# Patient Record
Sex: Male | Born: 1970 | Race: White | Hispanic: No | Marital: Single | Smoking: Current every day smoker
Health system: Southern US, Community
[De-identification: ages and names within clinical notes are randomized; demographics above are authoritative.]

## PROBLEM LIST (undated history)

## (undated) DIAGNOSIS — J449 Chronic obstructive pulmonary disease, unspecified: Secondary | ICD-10-CM

---

## 2002-08-16 ENCOUNTER — Inpatient Hospital Stay (HOSPITAL_COMMUNITY): Admission: EM | Admit: 2002-08-16 | Discharge: 2002-08-17 | Payer: Self-pay | Admitting: Emergency Medicine

## 2002-08-16 ENCOUNTER — Encounter: Payer: Self-pay | Admitting: Emergency Medicine

## 2004-01-23 ENCOUNTER — Emergency Department: Payer: Self-pay | Admitting: Emergency Medicine

## 2004-02-13 ENCOUNTER — Emergency Department: Payer: Self-pay | Admitting: Emergency Medicine

## 2004-08-13 ENCOUNTER — Emergency Department: Payer: Self-pay | Admitting: Emergency Medicine

## 2004-09-25 ENCOUNTER — Emergency Department: Payer: Self-pay | Admitting: Emergency Medicine

## 2004-10-09 ENCOUNTER — Emergency Department: Payer: Self-pay | Admitting: Emergency Medicine

## 2004-11-19 ENCOUNTER — Emergency Department: Payer: Self-pay | Admitting: Emergency Medicine

## 2005-08-08 ENCOUNTER — Emergency Department: Payer: Self-pay | Admitting: Emergency Medicine

## 2005-09-07 ENCOUNTER — Emergency Department: Payer: Self-pay | Admitting: Emergency Medicine

## 2007-05-18 ENCOUNTER — Emergency Department: Payer: Self-pay | Admitting: Emergency Medicine

## 2007-12-08 ENCOUNTER — Emergency Department: Payer: Self-pay | Admitting: Emergency Medicine

## 2007-12-08 ENCOUNTER — Other Ambulatory Visit: Payer: Self-pay

## 2008-09-26 ENCOUNTER — Emergency Department: Payer: Self-pay | Admitting: Emergency Medicine

## 2008-10-17 ENCOUNTER — Emergency Department: Payer: Self-pay | Admitting: Unknown Physician Specialty

## 2009-03-07 ENCOUNTER — Emergency Department: Payer: Self-pay | Admitting: Emergency Medicine

## 2009-05-05 ENCOUNTER — Emergency Department: Payer: Self-pay | Admitting: Emergency Medicine

## 2010-07-31 ENCOUNTER — Emergency Department: Payer: Self-pay | Admitting: Emergency Medicine

## 2010-08-07 ENCOUNTER — Emergency Department: Payer: Self-pay | Admitting: Internal Medicine

## 2011-09-16 ENCOUNTER — Emergency Department: Payer: Self-pay | Admitting: Internal Medicine

## 2011-09-16 LAB — COMPREHENSIVE METABOLIC PANEL
Albumin: 3.8 g/dL (ref 3.4–5.0)
Alkaline Phosphatase: 78 U/L (ref 50–136)
BUN: 16 mg/dL (ref 7–18)
Glucose: 91 mg/dL (ref 65–99)
Potassium: 4.1 mmol/L (ref 3.5–5.1)
SGOT(AST): 33 U/L (ref 15–37)
SGPT (ALT): 10 U/L — ABNORMAL LOW
Total Protein: 7.2 g/dL (ref 6.4–8.2)

## 2011-09-16 LAB — CBC
HCT: 41.8 % (ref 40.0–52.0)
HGB: 14.2 g/dL (ref 13.0–18.0)
MCHC: 33.9 g/dL (ref 32.0–36.0)
Platelet: 154 10*3/uL (ref 150–440)
WBC: 10 10*3/uL (ref 3.8–10.6)

## 2011-09-16 LAB — PROTIME-INR
INR: 0.9
Prothrombin Time: 12.4 secs (ref 11.5–14.7)

## 2011-09-16 LAB — LIPASE, BLOOD: Lipase: 208 U/L (ref 73–393)

## 2011-10-07 ENCOUNTER — Emergency Department: Payer: Self-pay | Admitting: Emergency Medicine

## 2011-10-13 ENCOUNTER — Emergency Department: Payer: Self-pay | Admitting: *Deleted

## 2012-01-03 ENCOUNTER — Emergency Department: Payer: Self-pay | Admitting: Emergency Medicine

## 2012-01-03 LAB — COMPREHENSIVE METABOLIC PANEL
Albumin: 4 g/dL (ref 3.4–5.0)
Anion Gap: 8 (ref 7–16)
Chloride: 102 mmol/L (ref 98–107)
EGFR (African American): >60
Osmolality: 274 (ref 275–301)
Potassium: 4.1 mmol/L (ref 3.5–5.1)
SGOT(AST): 21 U/L (ref 15–37)
Sodium: 137 mmol/L (ref 136–145)
Total Protein: 8 g/dL (ref 6.4–8.2)

## 2012-01-03 LAB — CBC
HCT: 45.1 % (ref 40.0–52.0)
MCHC: 34.1 g/dL (ref 32.0–36.0)
MCV: 93 fL (ref 80–100)
Platelet: 134 10*3/uL — ABNORMAL LOW (ref 150–440)
RDW: 13.5 % (ref 11.5–14.5)
WBC: 13.2 10*3/uL — ABNORMAL HIGH (ref 3.8–10.6)

## 2012-01-03 LAB — TROPONIN I
Troponin-I: <0.02 ng/mL
Troponin-I: <0.02 ng/mL

## 2012-01-03 LAB — ETHANOL
Ethanol %: <0.003 % (ref 0.000–0.080)
Ethanol: <3 mg/dL

## 2012-01-03 LAB — CK TOTAL AND CKMB (NOT AT ARMC)
CK, Total: 111 U/L (ref 35–232)
CK-MB: <0.5 ng/mL — ABNORMAL LOW (ref 0.5–3.6)

## 2012-01-03 LAB — LIPASE, BLOOD: Lipase: 120 U/L (ref 73–393)

## 2012-01-04 ENCOUNTER — Telehealth: Payer: Self-pay

## 2012-01-04 NOTE — Telephone Encounter (Signed)
I spoke with Silvio Pate at [facility]. She says they are booked out x 1 month for new pts but gave me # for [facility]. I called [facility] and spoke with rep there. She says they will call pt to schedule appt with him. I gave her pt's cell number. She will get in contact with pt.

## 2012-01-04 NOTE — Telephone Encounter (Signed)
Pt called saying he was seen in ER yesterday for c/o severe buring in chest Says the pain is made worse after eating Was dx with esophagitis in ER and was told to f/u with Dr. Mariah Milling  Pt calling to say he is having this pain again. Says he just ate and pain is "severe". Says it is burning/"on fire" in throat and above stomach area.  Pt was prescribed famotidine and prilosec in ER but he has yet to get this filled. He wants to know if he can be seen today.  I had pt hold while I discussed with Dr. Mariah Milling.  Dr. Mariah Milling feels this is GI related and should f/u with PCP ASAP. Pt does not have a PCP. Has Medicaid. Dr. Mariah Milling advised to have pt get RX filled and take prilosec 2 tablets daily with the famotidine. He also suggests we get pt into the Dr. Wyn Quaker clinic to be evaluated.    I informed pt of Dr. Windell Hummingbird instruction and told him I would call the clinic to get him an appt and will then call him back at [phone]. Understanding verb.

## 2012-01-10 ENCOUNTER — Encounter: Payer: Self-pay | Admitting: Cardiovascular Disease

## 2013-06-22 ENCOUNTER — Emergency Department: Payer: Self-pay | Admitting: Emergency Medicine

## 2013-06-22 LAB — CBC
HCT: 48.2 % (ref 40.0–52.0)
HGB: 15.4 g/dL (ref 13.0–18.0)
MCH: 29.3 pg (ref 26.0–34.0)
MCHC: 31.9 g/dL — AB (ref 32.0–36.0)
MCV: 92 fL (ref 80–100)
PLATELETS: 155 10*3/uL (ref 150–440)
RBC: 5.25 10*6/uL (ref 4.40–5.90)
RDW: 13.8 % (ref 11.5–14.5)
WBC: 5.2 10*3/uL (ref 3.8–10.6)

## 2013-06-22 LAB — COMPREHENSIVE METABOLIC PANEL
ALBUMIN: 4.1 g/dL (ref 3.4–5.0)
ALK PHOS: 98 U/L
ALT: 16 U/L (ref 12–78)
ANION GAP: 2 — AB (ref 7–16)
BILIRUBIN TOTAL: 0.2 mg/dL (ref 0.2–1.0)
BUN: 8 mg/dL (ref 7–18)
CO2: 28 mmol/L (ref 21–32)
Calcium, Total: 7.9 mg/dL — ABNORMAL LOW (ref 8.5–10.1)
Chloride: 113 mmol/L — ABNORMAL HIGH (ref 98–107)
Creatinine: 0.94 mg/dL (ref 0.60–1.30)
EGFR (African American): >60
EGFR (Non-African Amer.): >60
GLUCOSE: 115 mg/dL — AB (ref 65–99)
Osmolality: 284 (ref 275–301)
POTASSIUM: 3.8 mmol/L (ref 3.5–5.1)
SGOT(AST): 31 U/L (ref 15–37)
Sodium: 143 mmol/L (ref 136–145)
Total Protein: 8.2 g/dL (ref 6.4–8.2)

## 2013-06-22 LAB — SALICYLATE LEVEL: Salicylates, Serum: 3.8 mg/dL — ABNORMAL HIGH

## 2013-06-22 LAB — ACETAMINOPHEN LEVEL: Acetaminophen: <2 ug/mL

## 2013-06-22 LAB — ETHANOL
ETHANOL %: 0.322 % — AB (ref 0.000–0.080)
Ethanol: 322 mg/dL

## 2013-06-23 LAB — URINALYSIS, COMPLETE
Bacteria: NONE SEEN
Bilirubin,UR: NEGATIVE
Blood: NEGATIVE
Glucose,UR: NEGATIVE mg/dL (ref 0–75)
KETONE: NEGATIVE
LEUKOCYTE ESTERASE: NEGATIVE
NITRITE: NEGATIVE
PROTEIN: NEGATIVE
Ph: 5 (ref 4.5–8.0)
RBC,UR: NONE SEEN /HPF (ref 0–5)
Specific Gravity: 1.009 (ref 1.003–1.030)
Squamous Epithelial: NONE SEEN

## 2013-06-23 LAB — DRUG SCREEN, URINE

## 2013-10-25 ENCOUNTER — Emergency Department: Payer: Self-pay | Admitting: Emergency Medicine

## 2013-10-25 LAB — BASIC METABOLIC PANEL
Anion Gap: 8 (ref 7–16)
BUN: 11 mg/dL (ref 7–18)
Calcium, Total: 8 mg/dL — ABNORMAL LOW (ref 8.5–10.1)
Chloride: 110 mmol/L — ABNORMAL HIGH (ref 98–107)
Co2: 22 mmol/L (ref 21–32)
Creatinine: 1.15 mg/dL (ref 0.60–1.30)
EGFR (African American): >60
EGFR (Non-African Amer.): >60
Glucose: 97 mg/dL (ref 65–99)
Osmolality: 279 (ref 275–301)
Potassium: 3.4 mmol/L — ABNORMAL LOW (ref 3.5–5.1)
Sodium: 140 mmol/L (ref 136–145)

## 2013-10-25 LAB — URINALYSIS, COMPLETE
BACTERIA: NONE SEEN
BLOOD: NEGATIVE
Bilirubin,UR: NEGATIVE
Glucose,UR: NEGATIVE mg/dL (ref 0–75)
Ketone: NEGATIVE
LEUKOCYTE ESTERASE: NEGATIVE
Nitrite: NEGATIVE
PH: 5 (ref 4.5–8.0)
Protein: NEGATIVE
RBC, UR: NONE SEEN /HPF (ref 0–5)
Specific Gravity: 1.003 (ref 1.003–1.030)
Squamous Epithelial: NONE SEEN
WBC UR: <1 /HPF (ref 0–5)

## 2013-10-25 LAB — ETHANOL
Ethanol %: 0.326 % (ref 0.000–0.080)
Ethanol: 326 mg/dL

## 2013-10-25 LAB — CBC
HCT: 43.8 % (ref 40.0–52.0)
HGB: 14.7 g/dL (ref 13.0–18.0)
MCH: 31.6 pg (ref 26.0–34.0)
MCHC: 33.5 g/dL (ref 32.0–36.0)
MCV: 94 fL (ref 80–100)
Platelet: 144 10*3/uL — ABNORMAL LOW (ref 150–440)
RBC: 4.64 10*6/uL (ref 4.40–5.90)
RDW: 13.4 % (ref 11.5–14.5)
WBC: 9.1 10*3/uL (ref 3.8–10.6)

## 2013-10-25 LAB — DRUG SCREEN, URINE
Amphetamines, Ur Screen: NEGATIVE (ref ?–1000)
BARBITURATES, UR SCREEN: NEGATIVE (ref ?–200)
BENZODIAZEPINE, UR SCRN: NEGATIVE (ref ?–200)
Cannabinoid 50 Ng, Ur ~~LOC~~: NEGATIVE (ref ?–50)
Cocaine Metabolite,Ur ~~LOC~~: NEGATIVE (ref ?–300)
MDMA (ECSTASY) UR SCREEN: NEGATIVE (ref ?–500)
METHADONE, UR SCREEN: NEGATIVE (ref ?–300)
OPIATE, UR SCREEN: NEGATIVE (ref ?–300)
PHENCYCLIDINE (PCP) UR S: NEGATIVE (ref ?–25)
TRICYCLIC, UR SCREEN: NEGATIVE (ref ?–1000)

## 2014-02-20 ENCOUNTER — Emergency Department: Payer: Self-pay | Admitting: Emergency Medicine

## 2014-02-21 LAB — ETHANOL: Ethanol: 355 mg/dL

## 2015-09-28 ENCOUNTER — Emergency Department
Admission: EM | Admit: 2015-09-28 | Discharge: 2015-09-29 | Discharge status: Against Medical Advice | Payer: Medicaid Other | Attending: Emergency Medicine | Admitting: Emergency Medicine

## 2015-09-28 DIAGNOSIS — R197 Diarrhea, unspecified: Secondary | ICD-10-CM | POA: Diagnosis not present

## 2015-09-28 DIAGNOSIS — Z79899 Other long term (current) drug therapy: Secondary | ICD-10-CM | POA: Insufficient documentation

## 2015-09-28 DIAGNOSIS — F17200 Nicotine dependence, unspecified, uncomplicated: Secondary | ICD-10-CM | POA: Insufficient documentation

## 2015-09-28 DIAGNOSIS — R112 Nausea with vomiting, unspecified: Secondary | ICD-10-CM | POA: Diagnosis present

## 2015-09-28 DIAGNOSIS — R1030 Lower abdominal pain, unspecified: Secondary | ICD-10-CM | POA: Insufficient documentation

## 2015-09-28 DIAGNOSIS — J449 Chronic obstructive pulmonary disease, unspecified: Secondary | ICD-10-CM | POA: Insufficient documentation

## 2015-09-28 HISTORY — DX: Chronic obstructive pulmonary disease, unspecified: J44.9

## 2015-09-28 LAB — COMPREHENSIVE METABOLIC PANEL
ALBUMIN: 4.3 g/dL (ref 3.5–5.0)
ALK PHOS: 60 U/L (ref 38–126)
ALT: 11 U/L — AB (ref 17–63)
AST: 29 U/L (ref 15–41)
Anion gap: 8 (ref 5–15)
BUN: 14 mg/dL (ref 6–20)
CALCIUM: 9.2 mg/dL (ref 8.9–10.3)
CHLORIDE: 107 mmol/L (ref 101–111)
CO2: 27 mmol/L (ref 22–32)
Creatinine, Ser: 1.01 mg/dL (ref 0.61–1.24)
GFR calc Af Amer: >60 mL/min (ref >60)
GFR calc non Af Amer: >60 mL/min (ref >60)
Glucose, Bld: 92 mg/dL (ref 65–99)
Potassium: 3.4 mmol/L — ABNORMAL LOW (ref 3.5–5.1)
SODIUM: 142 mmol/L (ref 135–145)
Total Bilirubin: 0.7 mg/dL (ref 0.3–1.2)
Total Protein: 7.8 g/dL (ref 6.5–8.1)

## 2015-09-28 LAB — CBC
HCT: 45.7 % (ref 40.0–52.0)
HEMOGLOBIN: 15.9 g/dL (ref 13.0–18.0)
MCH: 33 pg (ref 26.0–34.0)
MCHC: 34.9 g/dL (ref 32.0–36.0)
MCV: 94.5 fL (ref 80.0–100.0)
Platelets: 154 10*3/uL (ref 150–440)
RBC: 4.83 MIL/uL (ref 4.40–5.90)
RDW: 13.9 % (ref 11.5–14.5)
WBC: 7.5 10*3/uL (ref 3.8–10.6)

## 2015-09-28 LAB — LIPASE, BLOOD: Lipase: 28 U/L (ref 11–51)

## 2015-09-28 MED ORDER — MORPHINE SULFATE (PF) 4 MG/ML IV SOLN
4.0000 mg | Freq: Once | INTRAVENOUS | Status: AC
  Administered 2015-09-28: 4 mg via INTRAVENOUS

## 2015-09-28 MED ORDER — ONDANSETRON HCL 4 MG/2ML IJ SOLN
4.0000 mg | Freq: Once | INTRAMUSCULAR | Status: AC
  Administered 2015-09-28: 4 mg via INTRAVENOUS
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
## 2015-09-28 NOTE — ED Provider Notes (Signed)
[facility] Emergency Department Provider Note   ____________________________________________  Time seen: Approximately 11:58 PM  I have reviewed the triage vital signs and the nursing notes.   HISTORY  Chief Complaint Abdominal Pain    HPI Tony Wright is a 45 y.o. male who comes into the hospital today with abdominal pain. The patient reports that he's had pain for the last week. He feels as though he has meals in his stomach. He reports the stomach is hurting bad and he hasn't taken anything for pain. The patient reports he wakes up every morning vomiting and he has nausea for the last 4 days. He reports that he also has 10 loose bowel movements daily. He reports that he's had the problems with his bowel movements for months. The patient drinks daily normally about 10 cans a day but he only had 6 cans today. The patient rates his pain a 10 out of 10 in intensity. He reports his never had pain this bad before. He is unsure what score on. He is here for evaluation.   Past Medical History  Diagnosis Date  . COPD (chronic obstructive pulmonary disease) (HCC)     There are no active problems to display for this patient.   History reviewed. No pertinent past surgical history.  Current Outpatient Rx  Name  Route  Sig  Dispense  Refill  . omeprazole (PRILOSEC) 20 MG capsule   Oral   Take 20 mg by mouth daily as needed (heartburn).           Allergies Review of patient's allergies indicates no known allergies.  History reviewed. No pertinent family history.  Social History Social History  Substance Use Topics  . Smoking status: Current Every Day Smoker  . Smokeless tobacco: None  . Alcohol Use: Yes    Review of Systems Constitutional: No fever/chills Eyes: No visual changes. ENT: No sore throat. Cardiovascular: Denies chest pain. Respiratory: Denies shortness of breath. Gastrointestinal:  abdominal pain, diarrhea, nausea and  vomiting Genitourinary: Negative for dysuria. Musculoskeletal: Negative for back pain. Skin: Negative for rash. Neurological: Negative for headaches, focal weakness or numbness.  10-point ROS otherwise negative.  ____________________________________________   PHYSICAL EXAM:  VITAL SIGNS: ED Triage Vitals  Enc Vitals Group     BP 09/28/15 2254 164/90 mmHg     Pulse Rate 09/28/15 2254 71     Resp 09/28/15 2254 16     Temp 09/28/15 2254 97.5 F (36.4 C)     Temp Source 09/28/15 2254 Oral     SpO2 09/28/15 2254 100 %     Weight 09/28/15 2254 100 lb (45.36 kg)     Height 09/28/15 2254 5\' 6"  (1.676 m)     Head Cir --      Peak Flow --      Pain Score 09/28/15 2257 8     Pain Loc --      Pain Edu? --      Excl. in GC? --     Constitutional: Alert and oriented. Well appearing and in mild distress. Eyes: Conjunctivae are normal. PERRL. EOMI. Head: Atraumatic. Nose: No congestion/rhinnorhea. Mouth/Throat: Mucous membranes are moist.  Oropharynx non-erythematous. Cardiovascular: Normal rate, regular rhythm. Grossly normal heart sounds.  Good peripheral circulation. Respiratory: Normal respiratory effort.  No retractions. Lungs CTAB. Gastrointestinal: Soft with diffuse tenderness to palpation. No distention. positive bowel sounds Musculoskeletal: No lower extremity tenderness nor edema.   Neurologic:  Normal speech and language.  Skin:  Skin is warm, dry and intact.  Psychiatric: Mood and affect are normal.   ____________________________________________   LABS (all labs ordered are listed, but only abnormal results are displayed)  Labs Reviewed  COMPREHENSIVE METABOLIC PANEL - Abnormal; Notable for the following:    Potassium 3.4 (*)    ALT 11 (*)    All other components within normal limits  URINALYSIS COMPLETEWITH MICROSCOPIC ([facility] ONLY) - Abnormal; Notable for the following:    Color, Urine STRAW (*)    APPearance CLEAR (*)    Specific Gravity, Urine 1.002 (*)     Hgb urine dipstick 1+ (*)    Squamous Epithelial / LPF 0-5 (*)    All other components within normal limits  ETHANOL - Abnormal; Notable for the following:    Alcohol, Ethyl (B) 238 (*)    All other components within normal limits  LIPASE, BLOOD  CBC  TROPONIN I   ____________________________________________  EKG  ED ECG REPORT I, Rebecka Apley, the attending physician, personally viewed and interpreted this ECG.   Date: 09/28/2015  EKG Time: 2301  Rate: 72  Rhythm: normal sinus rhythm  Axis: normal  Intervals:none  ST&T Change: none  ____________________________________________  RADIOLOGY  CT abd and pelvis: pending ____________________________________________   PROCEDURES  Procedure(s) performed: None  Procedures  Critical Care performed: No  ____________________________________________   INITIAL IMPRESSION / ASSESSMENT AND PLAN / ED COURSE  Pertinent labs & imaging results that were available during my care of the patient were reviewed by me and considered in my medical decision making (see chart for details).  This is a 45 year old male who comes into the hospital today with abdominal pain. The patient does drink daily. I will check some blood work as well as a CT scan and reevaluate the patient.  As the patient was awaiting the results of his CT scan the patient stated that he wanted to leave so he got outside and smoke. We informed him that he would not be able to do so. The patient became very upset and decided that he wanted to leave. He reports he does not want to be here all night and he has no other need to be here. The patient's CT results were not back at the time of the patient's disposition. The patient signed to leave AGAINST MEDICAL ADVICE. The patient is walking around does not slurring his speech and is clinically sober. The patient's family is also here with him. The patient understands that as we do not have all of the results there is a  risk that he may go home and he may die and he reports that he accepts that risk. The patient left the emergency department without the results of his imaging.   ----------------------------------------- 2:47 AM on 09/29/2015 -----------------------------------------  CT abdomen and pelvis: Sigmoid thickening is likely chronic and from diverticulosis, if diarrhea colitis would also be considered  The patient again left the hospital and we will attempt to get his results to him. ____________________________________________   FINAL CLINICAL IMPRESSION(S) / ED DIAGNOSES  Final diagnoses:  Lower abdominal pain      NEW MEDICATIONS STARTED DURING THIS VISIT:  Discharge Medication List as of 09/29/2015  2:30 AM       Note:  This document was prepared using Dragon voice recognition software and may include unintentional dictation errors.    Rebecka Apley, MD 09/29/15 [phone]

## 2015-09-28 NOTE — ED Notes (Signed)
Pt is cursing a lot  Pt states his middle abdomen has been hurting for a week or so. States diarrhea but denies N&V. Pt states he drinks "everyday." States he drank 6 beers today. Pt states abdomen feels like "pins and needles".   Hx of ETHOH abuse, COPD

## 2015-09-29 ENCOUNTER — Emergency Department: Payer: Medicaid Other

## 2015-09-29 LAB — URINALYSIS COMPLETE WITH MICROSCOPIC (ARMC ONLY)
BACTERIA UA: NONE SEEN
BILIRUBIN URINE: NEGATIVE
Glucose, UA: NEGATIVE mg/dL
Ketones, ur: NEGATIVE mg/dL
LEUKOCYTES UA: NEGATIVE
NITRITE: NEGATIVE
PH: 6 (ref 5.0–8.0)
Protein, ur: NEGATIVE mg/dL
RBC / HPF: NONE SEEN RBC/hpf (ref 0–5)
Specific Gravity, Urine: 1.002 — ABNORMAL LOW (ref 1.005–1.030)

## 2015-09-29 LAB — TROPONIN I: Troponin I: <0.03 ng/mL (ref <0.03)

## 2015-09-29 LAB — ETHANOL: ALCOHOL ETHYL (B): 238 mg/dL — AB (ref <5)

## 2015-09-29 MED ORDER — GI COCKTAIL ~~LOC~~
30.0000 mL | Freq: Once | ORAL | Status: AC
  Administered 2015-09-29: 30 mL via ORAL
  Filled 2015-09-29: qty 30

## 2015-09-29 MED ORDER — DIATRIZOATE MEGLUMINE & SODIUM 66-10 % PO SOLN
30.0000 mL | Freq: Once | ORAL | Status: AC
  Administered 2015-09-29: 30 mL via ORAL

## 2015-09-29 MED ORDER — IOPAMIDOL (ISOVUE-300) INJECTION 61%
75.0000 mL | Freq: Once | INTRAVENOUS | Status: AC | PRN
  Administered 2015-09-29: 75 mL via INTRAVENOUS

## 2015-09-29 MED ORDER — SODIUM CHLORIDE 0.9 % IV BOLUS (SEPSIS)
1000.0000 mL | Freq: Once | INTRAVENOUS | Status: AC
  Administered 2015-09-29: 1000 mL via INTRAVENOUS

## 2015-09-29 MED ORDER — ONDANSETRON HCL 4 MG/2ML IJ SOLN
INTRAMUSCULAR | Status: AC
  Administered 2015-09-28: 4 mg via INTRAVENOUS
  Filled 2015-09-29: qty 2

## 2015-09-29 MED ORDER — MORPHINE SULFATE (PF) 4 MG/ML IV SOLN
INTRAVENOUS | Status: AC
  Administered 2015-09-28: 4 mg via INTRAVENOUS
  Filled 2015-09-29: qty 1

## 2015-09-29 NOTE — ED Notes (Signed)
Pt transported to CT via stretcher.  

## 2015-09-29 NOTE — ED Notes (Signed)
Pt finished drinking 1st bottle of contrast, gave GI cocktail at this time because pt won't start drinking 2nd bottle of contrast until 1:20am.   Pt cursing every few words.

## 2015-09-29 NOTE — ED Notes (Signed)
Pt is finished with 2nd bottle, CT notified.

## 2015-09-29 NOTE — ED Notes (Signed)
Pt is cursing at this RN about not receiving pain medicine. Pt was informed that he has had pain medicine.

## 2015-09-29 NOTE — ED Notes (Signed)
Pt agitated, yelling at staff. Stating he wants to go outside and smoke, informed he is not allowed to do that and that this Rn could get him a nicotene patch. Pt refused. Pt informed about signing out AMA, pt still yelling at staff. Pt ambulatory to lobby with [employer].

## 2015-10-21 ENCOUNTER — Emergency Department
Admission: EM | Admit: 2015-10-21 | Discharge: 2015-10-21 | Disposition: A | Discharge status: Home / Self Care | Payer: Medicaid Other | Attending: Emergency Medicine | Admitting: Emergency Medicine

## 2015-10-21 ENCOUNTER — Emergency Department: Payer: Medicaid Other

## 2015-10-21 DIAGNOSIS — F17200 Nicotine dependence, unspecified, uncomplicated: Secondary | ICD-10-CM | POA: Insufficient documentation

## 2015-10-21 DIAGNOSIS — R079 Chest pain, unspecified: Secondary | ICD-10-CM | POA: Diagnosis present

## 2015-10-21 DIAGNOSIS — J441 Chronic obstructive pulmonary disease with (acute) exacerbation: Secondary | ICD-10-CM

## 2015-10-21 LAB — CBC WITH DIFFERENTIAL/PLATELET
BASOS PCT: 0 %
Basophils Absolute: 0 10*3/uL (ref 0–0.1)
EOS PCT: 2 %
Eosinophils Absolute: 0.1 10*3/uL (ref 0–0.7)
HEMATOCRIT: 43 % (ref 40.0–52.0)
Hemoglobin: 14.8 g/dL (ref 13.0–18.0)
Lymphocytes Relative: 30 %
Lymphs Abs: 2.5 10*3/uL (ref 1.0–3.6)
MCH: 33.2 pg (ref 26.0–34.0)
MCHC: 34.5 g/dL (ref 32.0–36.0)
MCV: 96.2 fL (ref 80.0–100.0)
MONO ABS: 0.5 10*3/uL (ref 0.2–1.0)
MONOS PCT: 7 %
NEUTROS ABS: 5.1 10*3/uL (ref 1.4–6.5)
Neutrophils Relative %: 61 %
Platelets: 120 10*3/uL — ABNORMAL LOW (ref 150–440)
RBC: 4.47 MIL/uL (ref 4.40–5.90)
RDW: 13.4 % (ref 11.5–14.5)
WBC: 8.3 10*3/uL (ref 3.8–10.6)

## 2015-10-21 LAB — BASIC METABOLIC PANEL
Anion gap: 6 (ref 5–15)
BUN: 7 mg/dL (ref 6–20)
CALCIUM: 9.1 mg/dL (ref 8.9–10.3)
CO2: 25 mmol/L (ref 22–32)
CREATININE: 0.92 mg/dL (ref 0.61–1.24)
Chloride: 109 mmol/L (ref 101–111)
GFR calc Af Amer: >60 mL/min (ref >60)
GFR calc non Af Amer: >60 mL/min (ref >60)
GLUCOSE: 87 mg/dL (ref 65–99)
Potassium: 3.9 mmol/L (ref 3.5–5.1)
Sodium: 140 mmol/L (ref 135–145)

## 2015-10-21 LAB — TROPONIN I: Troponin I: <0.03 ng/mL (ref <0.03)

## 2015-10-21 MED ORDER — FAMOTIDINE 20 MG PO TABS
40.0000 mg | ORAL_TABLET | Freq: Once | ORAL | Status: AC
  Administered 2015-10-21: 40 mg via ORAL

## 2015-10-21 MED ORDER — IPRATROPIUM-ALBUTEROL 0.5-2.5 (3) MG/3ML IN SOLN
RESPIRATORY_TRACT | Status: AC
  Filled 2015-10-21: qty 3

## 2015-10-21 MED ORDER — ALBUTEROL SULFATE (2.5 MG/3ML) 0.083% IN NEBU
2.5000 mg | INHALATION_SOLUTION | Freq: Once | RESPIRATORY_TRACT | Status: AC
  Administered 2015-10-21: 2.5 mg via RESPIRATORY_TRACT

## 2015-10-21 MED ORDER — FAMOTIDINE 20 MG PO TABS
ORAL_TABLET | ORAL | Status: AC
  Administered 2015-10-21: 40 mg via ORAL
  Filled 2015-10-21: qty 2

## 2015-10-21 MED ORDER — AZITHROMYCIN 250 MG PO TABS: ORAL_TABLET | ORAL | 0 refills | Status: AC

## 2015-10-21 MED ORDER — AZITHROMYCIN 500 MG PO TABS
ORAL_TABLET | ORAL | Status: AC
  Administered 2015-10-21: 500 mg via ORAL
  Filled 2015-10-21: qty 1

## 2015-10-21 MED ORDER — AZITHROMYCIN 250 MG PO TABS
500.0000 mg | ORAL_TABLET | Freq: Once | ORAL | Status: AC
  Administered 2015-10-21: 500 mg via ORAL

## 2015-10-21 MED ORDER — ALBUTEROL SULFATE HFA 108 (90 BASE) MCG/ACT IN AERS: 2.0000 | INHALATION_SPRAY | RESPIRATORY_TRACT | 0 refills | Status: AC | PRN

## 2015-10-21 MED ORDER — ALBUTEROL SULFATE (2.5 MG/3ML) 0.083% IN NEBU
INHALATION_SOLUTION | RESPIRATORY_TRACT | Status: AC
  Administered 2015-10-21: 2.5 mg via RESPIRATORY_TRACT
  Filled 2015-10-21: qty 3

## 2015-10-21 MED ORDER — PREDNISONE 20 MG PO TABS
ORAL_TABLET | ORAL | Status: AC
  Administered 2015-10-21: 60 mg via ORAL
  Filled 2015-10-21: qty 3

## 2015-10-21 MED ORDER — OMEPRAZOLE 20 MG PO CPDR: 20.0000 mg | DELAYED_RELEASE_CAPSULE | Freq: Every day | ORAL | 0 refills | Status: DC

## 2015-10-21 MED ORDER — PREDNISONE 20 MG PO TABS: 40.0000 mg | ORAL_TABLET | Freq: Every day | ORAL | 0 refills | Status: DC

## 2015-10-21 MED ORDER — PREDNISONE 20 MG PO TABS
60.0000 mg | ORAL_TABLET | Freq: Once | ORAL | Status: AC
  Administered 2015-10-21: 60 mg via ORAL

## 2015-10-21 NOTE — ED Provider Notes (Signed)
[facility] Emergency Department Provider Note   ____________________________________________   First MD Initiated Contact with Patient 10/21/15 2129     (approximate)  I have reviewed the triage vital signs and the nursing notes.   HISTORY  Chief Complaint Shortness of Breath   HPI Tony Wright is a 45 y.o. male with COPD as well as alcoholism who is presenting to the emergency department with shortness of breath over the past 2-3 weeks. He says that shortness of breath is worsened night and especially when he lays down. He denies any productive cough or fever. He says that he is out of his inhaler and thinks that this is the problem with his shortness of breath at this time. He is also expressing that he has had mild chest pain with his coughing over the past 2 weeks. Denies any pain at this time. Denies any chest trauma. Says that he smokes one to 2 packs per day. Says that he has only been drinking 3 drinks per day per week this week. Is also requesting a refill of his Prilosec.Patient says the chest pain is anterior nonradiating wanted as there. Does not report any pain with deep inspiration.   Past Medical History:  Diagnosis Date  . COPD (chronic obstructive pulmonary disease) (HCC)     There are no active problems to display for this patient.   No past surgical history on file.  Prior to Admission medications   Medication Sig Start Date End Date Taking? Authorizing Provider  omeprazole (PRILOSEC) 20 MG capsule Take 20 mg by mouth daily as needed (heartburn).    Historical Provider, MD    Allergies Review of patient's allergies indicates no known allergies.  No family history on file.  Social History Social History  Substance Use Topics  . Smoking status: Current Every Day Smoker  . Smokeless tobacco: Not on file  . Alcohol use Yes    Review of Systems Constitutional: No fever/chills Eyes: No visual changes. ENT: No sore  throat. Cardiovascular: As above Respiratory: As above Gastrointestinal: No abdominal pain.  No nausea, no vomiting.  No diarrhea.  No constipation. Genitourinary: Negative for dysuria. Musculoskeletal: Negative for back pain. Skin: Negative for rash. Neurological: Negative for headaches, focal weakness or numbness.  10-point ROS otherwise negative.  ____________________________________________   PHYSICAL EXAM:  VITAL SIGNS: ED Triage Vitals  Enc Vitals Group     BP 10/21/15 2115 127/86     Pulse Rate 10/21/15 2115 92     Resp 10/21/15 2115 17     Temp 10/21/15 2115 98 F (36.7 C)     Temp Source 10/21/15 2115 Oral     SpO2 10/21/15 2115 95 %     Weight 10/21/15 2115 100 lb (45.4 kg)     Height 10/21/15 2115 5\' 6"  (1.676 m)     Head Circumference --      Peak Flow --      Pain Score 10/21/15 2120 8     Pain Loc --      Pain Edu? --      Excl. in GC? --     Constitutional: Alert and oriented. Well appearing and in no acute distress. Eyes: Conjunctivae are normal. PERRL. EOMI. Head: Atraumatic. Nose: No congestion/rhinnorhea. Mouth/Throat: Mucous membranes are moist.   Neck: No stridor.   Cardiovascular: Normal rate, regular rhythm.Heart rate of 79 in the room. Grossly normal heart sounds.  Good peripheral circulation. Respiratory: Normal respiratory effort.  No retractions. Very  mild and scant wheezing on forced expiration. Clear to auscultation bilaterally when breathing normally. Gastrointestinal: Soft and nontender. No distention.  No CVA tenderness. Musculoskeletal: No lower extremity tenderness nor edema.  No joint effusions. Neurologic:  Normal speech and language. No gross focal neurologic deficits are appreciated. No gait instability. Skin:  Skin is warm, dry and intact. No rash noted. Psychiatric: Mood and affect are normal. Speech and behavior are normal.  ____________________________________________   LABS (all labs ordered are listed, but only abnormal  results are displayed)  Labs Reviewed  TROPONIN I  CBC WITH DIFFERENTIAL/PLATELET  BASIC METABOLIC PANEL   ____________________________________________  EKG  ED ECG REPORT I, Zedekiah Hinderman,  Teena Irani, the attending physician, personally viewed and interpreted this ECG.   Date: 10/21/2015  EKG Time: 2120  Rate: 99  Rhythm: normal sinus rhythm  Axis: Normal  Intervals:none  ST&T Change: No ST segment elevation or depression. Single T-wave inversion in aVL. No significant change from EKG done on 02/20/2014. ____________________________________________  RADIOLOGY DG Chest 2 View (Accession [id number]) (Order [id number])  Imaging  Date: 10/21/2015 Department: [facility] EMERGENCY DEPARTMENT Released By: Gerre Couch, RN (auto-released) Authorizing: Jeanmarie Plant, MD  PACS Images   Show images for DG Chest 2 View  Study Result   CLINICAL DATA:  Initial evaluation for acute chest pain shortness of breath for 1 week. History of COPD.  EXAM: CHEST  2 VIEW  COMPARISON:  Prior radiograph from 01/03/2012.  FINDINGS: Cardiac and mediastinal silhouettes are stable in size and contour, and remain within normal limits.  Lungs are hyperinflated with attenuation of the pulmonary markings, suggesting emphysema. No focal infiltrate, pulmonary edema, or pleural effusion. No pneumothorax.  No acute osseous abnormality.  IMPRESSION: 1. No active cardiopulmonary disease. 2. COPD.   Electronically Signed   By: Rise Mu M.D.   On: 10/21/2015 22:04     ____________________________________________   PROCEDURES  Procedure(s) performed:   Procedures  Critical Care performed:   ____________________________________________   INITIAL IMPRESSION / ASSESSMENT AND PLAN / ED COURSE  Pertinent labs & imaging results that were available during my care of the patient were reviewed by me and considered in my medical decision making (see chart  for details).  ----------------------------------------- 11:15 PM on 10/21/2015 -----------------------------------------  Patient was very reassuring labs as well as x-ray results. I'll treat the patient for COPD exacerbation. He is also requesting a Careers adviser for his Prilosec. He is able to ambulate without any difficulty. He is without any wheezing at this time.  Clinical Course     ____________________________________________   FINAL CLINICAL IMPRESSION(S) / ED DIAGNOSES  COPD exacerbation.    NEW MEDICATIONS STARTED DURING THIS VISIT:  New Prescriptions   No medications on file     Note:  This document was prepared using Dragon voice recognition software and may include unintentional dictation errors.    Myrna Blazer, MD 10/21/15 [phone]

## 2015-10-21 NOTE — ED Notes (Signed)
Pt back from Xray. Patient ambulated to and from Xray with NAD noted

## 2015-10-21 NOTE — ED Triage Notes (Signed)
Pt in with co shob and chest pain for 1 week, hx of the same. Coughing noted in triage with no shob at this time.

## 2015-12-09 ENCOUNTER — Emergency Department
Admission: EM | Admit: 2015-12-09 | Discharge: 2015-12-10 | Disposition: A | Discharge status: Home / Self Care | Payer: Medicaid Other | Attending: Emergency Medicine | Admitting: Emergency Medicine

## 2015-12-09 ENCOUNTER — Encounter: Payer: Self-pay | Admitting: Emergency Medicine

## 2015-12-09 ENCOUNTER — Emergency Department: Payer: Medicaid Other

## 2015-12-09 DIAGNOSIS — Y929 Unspecified place or not applicable: Secondary | ICD-10-CM | POA: Insufficient documentation

## 2015-12-09 DIAGNOSIS — F17200 Nicotine dependence, unspecified, uncomplicated: Secondary | ICD-10-CM | POA: Diagnosis not present

## 2015-12-09 DIAGNOSIS — Y999 Unspecified external cause status: Secondary | ICD-10-CM | POA: Diagnosis not present

## 2015-12-09 DIAGNOSIS — F10121 Alcohol abuse with intoxication delirium: Secondary | ICD-10-CM | POA: Insufficient documentation

## 2015-12-09 DIAGNOSIS — F1010 Alcohol abuse, uncomplicated: Secondary | ICD-10-CM

## 2015-12-09 DIAGNOSIS — F10921 Alcohol use, unspecified with intoxication delirium: Secondary | ICD-10-CM

## 2015-12-09 DIAGNOSIS — Y939 Activity, unspecified: Secondary | ICD-10-CM | POA: Diagnosis not present

## 2015-12-09 DIAGNOSIS — Z79899 Other long term (current) drug therapy: Secondary | ICD-10-CM | POA: Diagnosis not present

## 2015-12-09 DIAGNOSIS — S0181XA Laceration without foreign body of other part of head, initial encounter: Secondary | ICD-10-CM | POA: Diagnosis not present

## 2015-12-09 DIAGNOSIS — J449 Chronic obstructive pulmonary disease, unspecified: Secondary | ICD-10-CM | POA: Insufficient documentation

## 2015-12-09 DIAGNOSIS — S0993XA Unspecified injury of face, initial encounter: Secondary | ICD-10-CM | POA: Diagnosis present

## 2015-12-09 LAB — CBC WITH DIFFERENTIAL/PLATELET
BASOS PCT: 0 %
Basophils Absolute: 0 10*3/uL (ref 0–0.1)
EOS ABS: 0.1 10*3/uL (ref 0–0.7)
Eosinophils Relative: 1 %
HEMATOCRIT: 44.7 % (ref 40.0–52.0)
HEMOGLOBIN: 15.3 g/dL (ref 13.0–18.0)
LYMPHS ABS: 2.1 10*3/uL (ref 1.0–3.6)
Lymphocytes Relative: 23 %
MCH: 32.9 pg (ref 26.0–34.0)
MCHC: 34.3 g/dL (ref 32.0–36.0)
MCV: 95.9 fL (ref 80.0–100.0)
MONO ABS: 0.5 10*3/uL (ref 0.2–1.0)
MONOS PCT: 6 %
NEUTROS ABS: 6.2 10*3/uL (ref 1.4–6.5)
NEUTROS PCT: 70 %
Platelets: 137 10*3/uL — ABNORMAL LOW (ref 150–440)
RBC: 4.67 MIL/uL (ref 4.40–5.90)
RDW: 13.4 % (ref 11.5–14.5)
WBC: 8.8 10*3/uL (ref 3.8–10.6)

## 2015-12-09 LAB — COMPREHENSIVE METABOLIC PANEL
ALK PHOS: 67 U/L (ref 38–126)
ALT: 9 U/L — ABNORMAL LOW (ref 17–63)
ANION GAP: 10 (ref 5–15)
AST: 45 U/L — ABNORMAL HIGH (ref 15–41)
Albumin: 4.3 g/dL (ref 3.5–5.0)
BILIRUBIN TOTAL: 0.5 mg/dL (ref 0.3–1.2)
BUN: 13 mg/dL (ref 6–20)
CALCIUM: 8.5 mg/dL — AB (ref 8.9–10.3)
CO2: 21 mmol/L — ABNORMAL LOW (ref 22–32)
Chloride: 111 mmol/L (ref 101–111)
Creatinine, Ser: 1.02 mg/dL (ref 0.61–1.24)
Glucose, Bld: 91 mg/dL (ref 65–99)
POTASSIUM: 3.8 mmol/L (ref 3.5–5.1)
Sodium: 142 mmol/L (ref 135–145)
TOTAL PROTEIN: 7.5 g/dL (ref 6.5–8.1)

## 2015-12-09 LAB — TROPONIN I

## 2015-12-09 LAB — ETHANOL: ALCOHOL ETHYL (B): 358 mg/dL — AB (ref <5)

## 2015-12-09 MED ORDER — DIPHENHYDRAMINE HCL 50 MG/ML IJ SOLN: 50.0000 mg | Freq: Once | INTRAMUSCULAR | Status: DC

## 2015-12-09 MED ORDER — HALOPERIDOL LACTATE 5 MG/ML IJ SOLN
INTRAMUSCULAR | Status: AC
  Administered 2015-12-09: 10 mg via INTRAMUSCULAR
  Filled 2015-12-09: qty 2

## 2015-12-09 MED ORDER — DIPHENHYDRAMINE HCL 50 MG/ML IJ SOLN
50.0000 mg | Freq: Once | INTRAMUSCULAR | Status: AC
Start: 2015-12-09 — End: 2015-12-09
  Administered 2015-12-09: 50 mg via INTRAMUSCULAR

## 2015-12-09 MED ORDER — LORAZEPAM 2 MG/ML IJ SOLN
INTRAMUSCULAR | Status: AC
  Administered 2015-12-09: 2 mg via INTRAMUSCULAR
  Filled 2015-12-09: qty 1

## 2015-12-09 MED ORDER — DIPHENHYDRAMINE HCL 50 MG/ML IJ SOLN
INTRAMUSCULAR | Status: AC
  Filled 2015-12-09: qty 1

## 2015-12-09 MED ORDER — LORAZEPAM 2 MG/ML IJ SOLN
2.0000 mg | Freq: Once | INTRAMUSCULAR | Status: AC
  Administered 2015-12-09: 2 mg via INTRAMUSCULAR

## 2015-12-09 MED ORDER — HALOPERIDOL LACTATE 5 MG/ML IJ SOLN
10.0000 mg | Freq: Once | INTRAMUSCULAR | Status: AC
  Administered 2015-12-09: 10 mg via INTRAMUSCULAR
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
## 2015-12-09 NOTE — ED Notes (Signed)
Pt attempting to leave, stating, "I'm fine, I have someone out there waiting for me. I want to go home." MD notified. Pt placed under IVC by MD. Verbal orders received for IM medications. Pt took IM medications without incidence. Pt dressed out by Onalee Huaavid, RN and Kennith Centerracey, EDT.
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
## 2015-12-10 DIAGNOSIS — F1010 Alcohol abuse, uncomplicated: Secondary | ICD-10-CM
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
## 2015-12-10 NOTE — ED Provider Notes (Signed)
EXAM: CT HEAD WITHOUT CONTRAST  CT MAXILLOFACIAL WITHOUT CONTRAST  TECHNIQUE: Multidetector CT imaging of the head and maxillofacial structures were performed using the standard protocol without intravenous contrast. Multiplanar CT image reconstructions of the maxillofacial structures were also generated.  COMPARISON:  Head CT dated 02/21/2014  FINDINGS: CT HEAD FINDINGS  Brain: The ventricles and sulci appropriate in size for patient's age. Mild periventricular and deep white matter chronic microvascular ischemic changes noted. There is no acute intracranial hemorrhage. No mass effect or midline shift noted. No extra-axial fluid collection.  Vascular: No hyperdense vessel or unexpected calcification.  Skull: Normal. Negative for fracture or focal lesion.  Sinuses/Orbits: Mild mucoperiosteal thickening of paranasal sinuses. No air-fluid levels. The mastoid air cells are clear.  Other: There is skin laceration of the forehead with a small left forehead hematoma. A 2 mm high attenuating focus in the skin over the left forehead (series 3, image 27) may have been present on the prior CT and possibly chronic. Clinical correlation is recommended to evaluate for foreign object.  CT MAXILLOFACIAL FINDINGS  Osseous: There is a minimally depressed fracture of the right nasal bone, age indeterminate. There is no associated swelling of the overlying soft tissue. Although this may represent an old fracture. Acute fracture is not excluded. No other acute fracture identified. There is no dislocation.  Orbits: Negative. No traumatic or inflammatory finding.  Sinuses: There is mild mucoperiosteal thickening of paranasal sinuses. No air-fluid levels. The mastoid air cells are clear. There is chronic appearing erosive changes of the left maxilla likely related to chronic dental disease and periapical abscess. There is associated exophytic calcified mass extending into the  left maxillary sinus likely related to chronic dystrophic calcification.  Soft tissues: Left forehead and left periorbital soft tissue swelling and hematoma.  Multiple coarse calcific densities along the medial aspect of the right mandible may represent calcified lymph nodes versus less likely salivary duct stones. These measure up to 6 mm in diameter. There is no ductal dilatation.  IMPRESSION: No acute intracranial pathology.  Age indeterminate minimally displaced right nasal bone fracture. Clinical correlation is recommended.  Exophytic calcified mass extending into the left maxillary sinus, likely related to chronic dental disease and periapical abscess.   Electronically Signed   By: Elgie CollardArash  Radparvar M.D.   On: 12/10/2015 00:44   On exam there is no bruising tenderness laceration or anything on the right side of the patient's nose. There is an abrasion on the left orbital ridge.   Arnaldo NatalPaul F Yolinda Duerr, MD 12/10/15 [phone]

## 2015-12-10 NOTE — ED Notes (Signed)
Pt given urine cup for sample; he states he is ready to leave. Explained that we need urine sample and that he will need to see psychiatrist before a decision is made.
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
## 2015-12-10 NOTE — ED Notes (Signed)
Pt discharged home after verbalizing understanding of discharge instructions; nad noted. 

## 2015-12-10 NOTE — Discharge Instructions (Signed)
Please keep all your abrasions and lacerations clean and dry.  Please return to the emergency department for thoughts of hurting yourself or anyone else, hallucinations, or any other symptoms concerning to you.
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
## 2016-02-14 ENCOUNTER — Emergency Department
Admission: EM | Admit: 2016-02-14 | Discharge: 2016-02-14 | Disposition: A | Discharge status: Home / Self Care | Payer: Medicaid Other | Attending: Emergency Medicine | Admitting: Emergency Medicine

## 2016-02-14 ENCOUNTER — Encounter: Payer: Self-pay | Admitting: Emergency Medicine

## 2016-02-14 ENCOUNTER — Emergency Department: Payer: Medicaid Other

## 2016-02-14 DIAGNOSIS — J449 Chronic obstructive pulmonary disease, unspecified: Secondary | ICD-10-CM | POA: Diagnosis not present

## 2016-02-14 DIAGNOSIS — R1030 Lower abdominal pain, unspecified: Secondary | ICD-10-CM | POA: Diagnosis not present

## 2016-02-14 DIAGNOSIS — F17200 Nicotine dependence, unspecified, uncomplicated: Secondary | ICD-10-CM | POA: Diagnosis not present

## 2016-02-14 LAB — URINALYSIS COMPLETE WITH MICROSCOPIC (ARMC ONLY)
BILIRUBIN URINE: NEGATIVE
Bacteria, UA: NONE SEEN
Glucose, UA: NEGATIVE mg/dL
Hgb urine dipstick: NEGATIVE
KETONES UR: NEGATIVE mg/dL
Nitrite: NEGATIVE
PROTEIN: NEGATIVE mg/dL
Specific Gravity, Urine: 1.016 (ref 1.005–1.030)
pH: 5 (ref 5.0–8.0)

## 2016-02-14 LAB — COMPREHENSIVE METABOLIC PANEL
ALBUMIN: 4 g/dL (ref 3.5–5.0)
ALK PHOS: 70 U/L (ref 38–126)
ALT: 10 U/L — AB (ref 17–63)
AST: 28 U/L (ref 15–41)
Anion gap: 8 (ref 5–15)
BUN: 10 mg/dL (ref 6–20)
CALCIUM: 9.6 mg/dL (ref 8.9–10.3)
CO2: 26 mmol/L (ref 22–32)
CREATININE: 0.93 mg/dL (ref 0.61–1.24)
Chloride: 105 mmol/L (ref 101–111)
GFR calc Af Amer: >60 mL/min (ref >60)
GFR calc non Af Amer: >60 mL/min (ref >60)
GLUCOSE: 105 mg/dL — AB (ref 65–99)
Potassium: 3.9 mmol/L (ref 3.5–5.1)
SODIUM: 139 mmol/L (ref 135–145)
Total Bilirubin: 1.1 mg/dL (ref 0.3–1.2)
Total Protein: 7.4 g/dL (ref 6.5–8.1)

## 2016-02-14 LAB — CBC
HCT: 40.1 % (ref 40.0–52.0)
Hemoglobin: 13.9 g/dL (ref 13.0–18.0)
MCH: 33.4 pg (ref 26.0–34.0)
MCHC: 34.7 g/dL (ref 32.0–36.0)
MCV: 96.2 fL (ref 80.0–100.0)
PLATELETS: 131 10*3/uL — AB (ref 150–440)
RBC: 4.17 MIL/uL — ABNORMAL LOW (ref 4.40–5.90)
RDW: 13.7 % (ref 11.5–14.5)
WBC: 10.1 10*3/uL (ref 3.8–10.6)

## 2016-02-14 LAB — CHLAMYDIA/NGC RT PCR (ARMC ONLY)
Chlamydia Tr: NOT DETECTED
N GONORRHOEAE: NOT DETECTED

## 2016-02-14 LAB — LIPASE, BLOOD: Lipase: 24 U/L (ref 11–51)

## 2016-02-14 MED ORDER — METRONIDAZOLE 500 MG PO TABS
500.0000 mg | ORAL_TABLET | Freq: Once | ORAL | Status: AC
  Administered 2016-02-14: 500 mg via ORAL

## 2016-02-14 MED ORDER — IOPAMIDOL (ISOVUE-300) INJECTION 61%
30.0000 mL | Freq: Once | INTRAVENOUS | Status: AC | PRN
  Administered 2016-02-14: 30 mL via ORAL
  Filled 2016-02-14: qty 30

## 2016-02-14 MED ORDER — CIPROFLOXACIN HCL 500 MG PO TABS
500.0000 mg | ORAL_TABLET | Freq: Once | ORAL | Status: AC
  Administered 2016-02-14: 500 mg via ORAL

## 2016-02-14 MED ORDER — CIPROFLOXACIN HCL 500 MG PO TABS
ORAL_TABLET | ORAL | Status: AC
  Administered 2016-02-14: 500 mg via ORAL
  Filled 2016-02-14: qty 1

## 2016-02-14 MED ORDER — METRONIDAZOLE 500 MG PO TABS
ORAL_TABLET | ORAL | Status: AC
  Administered 2016-02-14: 500 mg via ORAL
  Filled 2016-02-14: qty 1

## 2016-02-14 MED ORDER — METRONIDAZOLE 500 MG PO TABS: 500.0000 mg | ORAL_TABLET | Freq: Three times a day (TID) | ORAL | 0 refills | Status: AC

## 2016-02-14 MED ORDER — SODIUM CHLORIDE 0.9 % IV BOLUS (SEPSIS)
1000.0000 mL | Freq: Once | INTRAVENOUS | Status: AC
  Administered 2016-02-14: 1000 mL via INTRAVENOUS

## 2016-02-14 MED ORDER — IOPAMIDOL (ISOVUE-300) INJECTION 61%
85.0000 mL | Freq: Once | INTRAVENOUS | Status: AC | PRN
  Administered 2016-02-14: 85 mL via INTRAVENOUS
  Filled 2016-02-14: qty 90

## 2016-02-14 MED ORDER — CIPROFLOXACIN HCL 500 MG PO TABS: 500.0000 mg | ORAL_TABLET | Freq: Two times a day (BID) | ORAL | 0 refills | Status: AC

## 2016-02-14 NOTE — ED Notes (Signed)
CT staff at bedside to give oral CT contrast to patient.

## 2016-02-14 NOTE — ED Notes (Signed)
ED Provider at bedside. 

## 2016-02-14 NOTE — ED Notes (Addendum)
Pt up to bathroom, ambulates with ease. Back to bed at this time. MD at bedside.

## 2016-02-14 NOTE — ED Provider Notes (Signed)
[facility] Emergency Department Provider Note  ____________________________________________   First MD Initiated Contact with Patient 02/14/16 1445     (approximate)  I have reviewed the triage vital signs and the nursing notes.   HISTORY  Chief Complaint Urinary Frequency   HPI Tony Wright is a 45 y.o. male with a history of COPD who is presenting to the emergency department today with lower abdominal pain. He says the pain is sharp and a 9 out of 10. He says it has been there constantly but does get worse and better without any warning. He says it started suddenly at 2 AM yesterday morning. He denies any nausea vomiting or diarrhea but does say that he has some burning with urination. He denies any urethral discharge or concern for STDs. Says that he was here in July for a similar issue but left the emergency department before getting the final results of his workup. Denies any recent unexpected weight loss. Denies any history of kidney stones. Denies any radiation of the pain through to his back.Also reports drinking about 8-10 beers per day. Has never had a colonoscopy.   Past Medical History:  Diagnosis Date  . COPD (chronic obstructive pulmonary disease) [facility])     Patient Active Problem List   Diagnosis Date Noted  . Alcohol abuse 12/10/2015    History reviewed. No pertinent surgical history.  Prior to Admission medications   Medication Sig Start Date End Date Taking? Authorizing Provider  albuterol (PROVENTIL HFA;VENTOLIN HFA) 108 (90 Base) MCG/ACT inhaler Inhale 2 puffs into the lungs every 4 (four) hours as needed for wheezing or shortness of breath. 10/21/15   Myrna Blazer, MD    Allergies Patient has no known allergies.  History reviewed. No pertinent family history.  Social History Social History  Substance Use Topics  . Smoking status: Current Every Day Smoker  . Smokeless tobacco: Never Used  . Alcohol use Yes     Review of Systems Constitutional: No fever/chills Eyes: No visual changes. ENT: No sore throat. Cardiovascular: Denies chest pain. Respiratory: Denies shortness of breath. Gastrointestinal: No nausea, no vomiting.  No diarrhea.  No constipation. Genitourinary: As above Musculoskeletal: Negative for back pain. Skin: Negative for rash. Neurological: Negative for headaches, focal weakness or numbness.  10-point ROS otherwise negative.  ____________________________________________   PHYSICAL EXAM:  VITAL SIGNS: ED Triage Vitals  Enc Vitals Group     BP 02/14/16 1300 (!) 161/96     Pulse Rate 02/14/16 1300 85     Resp 02/14/16 1300 20     Temp 02/14/16 1300 97.6 F (36.4 C)     Temp Source 02/14/16 1300 Oral     SpO2 02/14/16 1300 100 %     Weight 02/14/16 1300 120 lb (54.4 kg)     Height --      Head Circumference --      Peak Flow --      Pain Score 02/14/16 1301 10     Pain Loc --      Pain Edu? --      Excl. in GC? --     Constitutional: Alert and oriented. Well appearing and in no acute distress. Eyes: Conjunctivae are normal. PERRL. EOMI. Head: Atraumatic. Nose: No congestion/rhinnorhea. Mouth/Throat: Mucous membranes are moist.   Neck: No stridor.   Cardiovascular: Normal rate, regular rhythm. Grossly normal heart sounds.   Respiratory: Normal respiratory effort.  No retractions. Lungs CTAB. Gastrointestinal: Soft and nontender. No distention.  No CVA tenderness. Genitourinary:  Normal external appearance of the genitalia in this circumcised male. No tenderness to the penis or the testicles. No masses palpated. No discharge seen at the urethral meatus. Musculoskeletal: No lower extremity tenderness nor edema.  No joint effusions. Neurologic:  Normal speech and language. No gross focal neurologic deficits are appreciated. No gait instability. Skin:  Skin is warm, dry and intact. No rash noted. Psychiatric: Mood and affect are normal. Speech and behavior are  normal.  ____________________________________________   LABS (all labs ordered are listed, but only abnormal results are displayed)  Labs Reviewed  COMPREHENSIVE METABOLIC PANEL - Abnormal; Notable for the following:       Result Value   Glucose, Bld 105 (*)    ALT 10 (*)    All other components within normal limits  CBC - Abnormal; Notable for the following:    RBC 4.17 (*)    Platelets 131 (*)    All other components within normal limits  URINALYSIS COMPLETEWITH MICROSCOPIC ([facility] ONLY) - Abnormal; Notable for the following:    Color, Urine YELLOW (*)    APPearance CLEAR (*)    Leukocytes, UA 1+ (*)    Squamous Epithelial / LPF 0-5 (*)    All other components within normal limits  CHLAMYDIA/NGC RT PCR ([facility] ONLY)  URINE CULTURE  LIPASE, BLOOD   ____________________________________________  EKG   ____________________________________________  RADIOLOGY    CT Abdomen Pelvis W Contrast (Final result)  Result time 02/14/16 17:57:26  Final result by Eugenie Norrieobrinka V Dimitrova-Koutleva, MD (02/14/16 17:57:26)           Narrative:   CLINICAL DATA: Sharp mid and lower abdominal pain.  EXAM: CT ABDOMEN AND PELVIS WITH CONTRAST  TECHNIQUE: Multidetector CT imaging of the abdomen and pelvis was performed using the standard protocol following bolus administration of intravenous contrast.  CONTRAST: 85mL ISOVUE-300 IOPAMIDOL (ISOVUE-300) INJECTION 61%  COMPARISON: 09/29/2015  FINDINGS: Lower chest: Hyperinflation of the lungs.  Hepatobiliary: No focal liver abnormality is seen. No gallstones, gallbladder wall thickening, or biliary dilatation.  Pancreas: Unremarkable. No pancreatic ductal dilatation or surrounding inflammatory changes.  Spleen: Normal in size without focal abnormality.  Adrenals/Urinary Tract: Adrenal glands are unremarkable. Kidneys are normal, without renal calculi, focal lesion, or hydronephrosis. Bladder is  unremarkable.  Stomach/Bowel: Stomach is within normal limits. No evidence of appendicitis. No evidence of bowel obstruction. There is long segment of diffuse mucosal thickening of the distal sigmoid colon and proximal rectum. Scattered diverticula are also seen.  Vascular/Lymphatic: Aortic atherosclerosis. No enlarged abdominal or pelvic lymph nodes.  Reproductive: Heterogeneous appearance of the prostate gland. Diffuse symmetric urinary bladder wall thickening.  Other: No abdominal wall hernia or abnormality. No abdominopelvic ascites.  Musculoskeletal: No acute or significant osseous findings.  IMPRESSION: Long segment of diffuse mucosal thickening of the distal sigmoid colon and proximal rectum on the background of scattered diverticula. This may represent colitis, potentially diverticulitis. Underlying malignancy cannot be entirely excluded and correlation with colonoscopy, upon resolution of the acute symptoms is recommended.  Diffuse symmetric urinary bladder wall thickening and heterogeneous appearance of the prostate gland. Please correlate clinically regarding potential urinary bladder chronic outflow obstruction.   Electronically Signed By: Ted Mcalpineobrinka Dimitrova M.D. On: 02/14/2016 17:57          ____________________________________________   PROCEDURES  Procedure(s) performed:   Procedures  Critical Care performed:   ____________________________________________   INITIAL IMPRESSION / ASSESSMENT AND PLAN / ED COURSE  Pertinent labs & imaging results that were  available during my care of the patient were reviewed by me and considered in my medical decision making (see chart for details).  Clinical Course   ----------------------------------------- 6:45 PM on 02/14/2016 -----------------------------------------  Patient with findings that could be infection versus malignancy. I discussed this with him as well as the need for an outpatient  colonoscopy. He is understanding of the plan and willing to comply. Will be discharged with Cipro and Flagyl. Blood pressure high on recheck but patient also says that he needs to smoke a cigarette and is slightly anxious. This may be causing his high blood pressure.   ____________________________________________   FINAL CLINICAL IMPRESSION(S) / ED DIAGNOSES  Lower abdominal pain.    NEW MEDICATIONS STARTED DURING THIS VISIT:  New Prescriptions   No medications on file     Note:  This document was prepared using Dragon voice recognition software and may include unintentional dictation errors.    Myrna Blazeravid Matthew Niasha Devins, MD 02/14/16 [phone]

## 2016-02-14 NOTE — ED Notes (Signed)
Sharp lower mid abdominal pain since 2AM yesterday. Denies NVD. Normal BM today. Pt alert and oriented X4, active, cooperative, pt in NAD. RR even and unlabored, color WNL.

## 2016-02-14 NOTE — ED Triage Notes (Signed)
Pt to ed with c/o  Lower abd pain and burning worse with voiding.

## 2016-02-14 NOTE — ED Notes (Signed)
Pt alert and oriented X4, active, cooperative, pt in NAD. RR even and unlabored, color WNL.  Pt informed to return if any life threatening symptoms occur.  Pt encouraged to have close followup with PCP due to high BP. EDP aware of discharge BP reading. No further orders.

## 2016-02-14 NOTE — ED Notes (Signed)
Pt requesting IV be taken out, pt states his parents are on way to pick him up. Pt c/o being hungry and needing cigarette.

## 2016-02-14 NOTE — ED Notes (Signed)
Pt finished with oral CT contrast, CT staff informed and is on way to get patient.

## 2016-02-15 LAB — URINE CULTURE: CULTURE: NO GROWTH

## 2016-03-07 ENCOUNTER — Ambulatory Visit (INDEPENDENT_AMBULATORY_CARE_PROVIDER_SITE_OTHER): Payer: Medicaid Other | Admitting: Gastroenterology

## 2016-03-07 ENCOUNTER — Encounter: Payer: Self-pay | Admitting: Gastroenterology

## 2016-03-07 VITALS — BP 171/100 | HR 94 | Temp 98.1°F | Ht 65.0 in | Wt 93.0 lb

## 2016-03-07 DIAGNOSIS — F1010 Alcohol abuse, uncomplicated: Secondary | ICD-10-CM

## 2016-03-07 DIAGNOSIS — I10 Essential (primary) hypertension: Secondary | ICD-10-CM | POA: Diagnosis not present

## 2016-03-07 DIAGNOSIS — R935 Abnormal findings on diagnostic imaging of other abdominal regions, including retroperitoneum: Secondary | ICD-10-CM

## 2016-03-07 MED ORDER — AMLODIPINE BESYLATE 5 MG PO TABS: 5.0000 mg | ORAL_TABLET | Freq: Every day | ORAL | 1 refills | Status: AC

## 2016-03-07 MED ORDER — AMLODIPINE BESYLATE 5 MG PO TABS: 5.0000 mg | ORAL_TABLET | Freq: Every day | ORAL | 1 refills | Status: DC

## 2016-03-07 NOTE — Progress Notes (Signed)
Gastroenterology Consultation  Referring Provider:     No ref. provider found Primary Care Physician:  Pcp Not In System Primary Gastroenterologist:  Dr. Wyline MoodKiran Alis Sawchuk  Reason for Consultation:     Abdominal pain         HPI:   Tony Wright Loss is a 45 y.o. y/o male referred for consultation & management  by Dr. Oneita HurtPcp Not In System.    He is here today referred by the ER for abdominal pain.Marland Kitchen. He was seen at the ER on 02/14/16 when he presented with sudden onset abdominal pain associated with burning during urination . At that time admitted to drinking 8-10 beers a day . He underwent a CT scan of the abdomen which revealed diffuse mucosal thickening of the distal sigmoid colon and proximal rectum with a background of diverticula. Malignancy ,diverticulitis not excluded. Also noted were bladder wall thickening and heterogenous appearance of his prostate gland .   Hb 13.9 , low platelet count of 131.LFT and creatinine were normal  He was discharged on Ciprofloxacin and flagyl with need for OP follow up . He has never had a colonoscopy .  A prior CT scan in 09/2015 revealed thickening of the sigmoid colon as well    He says after his ER visit he did not take his antibiotics because he could not afford it.  He smokes daily . Not seen his PCP for atleast 1 year. Not known to be hypertensive.  Consumes alcohol still 140 oz a day for the past few years.   He says he is feeling well. Denies any abdominal pains. No acute issues. Denies any weight loss. No diarrhea. Has a bowel movement daily normal for him with no blood. No family history of colon cancer.     Past Medical History:  Diagnosis Date  . COPD (chronic obstructive pulmonary disease) ([facility])     No past surgical history on file.  Prior to Admission medications   Medication Sig Start Date End Date Taking? Authorizing Provider  albuterol (PROVENTIL HFA;VENTOLIN HFA) 108 (90 Base) MCG/ACT inhaler Inhale 2 puffs into the lungs every 4  (four) hours as needed for wheezing or shortness of breath. 10/21/15   Myrna Blazeravid Matthew Schaevitz, MD    No family history on file.   Social History  Substance Use Topics  . Smoking status: Current Every Day Smoker  . Smokeless tobacco: Never Used  . Alcohol use Yes    Allergies as of 03/07/2016  . (No Known Allergies)    Review of Systems:    All systems reviewed and negative except where noted in HPI.   Physical Exam:  There were no vitals taken for this visit. No LMP for male patient. Psych:  Alert and cooperative. Normal mood and affect. General:   Alert,  Very thin and cachectic appearing  Head:  Normocephalic and atraumatic. Eyes:  Sclera clear, no icterus.   Conjunctiva pink. Ears:  Normal auditory acuity. Nose:  No deformity, discharge, or lesions. Mouth:  No deformity or lesions,oropharynx pink & moist. Neck:  Supple; no masses or thyromegaly. Lungs:  Respirations even and unlabored.  Clear throughout to auscultation.   No wheezes, crackles, or rhonchi. No acute distress. Heart:  Regular rate and rhythm; no murmurs, clicks, rubs, or gallops. Abdomen:  Normal bowel sounds.  No bruits.  Soft, non-tender and non-distended without masses, hepatosplenomegaly or hernias noted.  No guarding or rebound tenderness.    Skin:  Intact without significant lesions or rashes. No jaundice.  Lymph Nodes:  No significant cervical adenopathy. Psych:  Alert and cooperative. Normal mood and affect.  Imaging Studies: Ct Abdomen Pelvis W Contrast  Result Date: 02/14/2016 CLINICAL DATA:  Sharp mid and lower abdominal pain. EXAM: CT ABDOMEN AND PELVIS WITH CONTRAST TECHNIQUE: Multidetector CT imaging of the abdomen and pelvis was performed using the standard protocol following bolus administration of intravenous contrast. CONTRAST:  85mL ISOVUE-300 IOPAMIDOL (ISOVUE-300) INJECTION 61% COMPARISON:  09/29/2015 FINDINGS: Lower chest: Hyperinflation of the lungs. Hepatobiliary: No focal liver  abnormality is seen. No gallstones, gallbladder wall thickening, or biliary dilatation. Pancreas: Unremarkable. No pancreatic ductal dilatation or surrounding inflammatory changes. Spleen: Normal in size without focal abnormality. Adrenals/Urinary Tract: Adrenal glands are unremarkable. Kidneys are normal, without renal calculi, focal lesion, or hydronephrosis. Bladder is unremarkable. Stomach/Bowel: Stomach is within normal limits. No evidence of appendicitis. No evidence of bowel obstruction. There is long segment of diffuse mucosal thickening of the distal sigmoid colon and proximal rectum. Scattered diverticula are also seen. Vascular/Lymphatic: Aortic atherosclerosis. No enlarged abdominal or pelvic lymph nodes. Reproductive: Heterogeneous appearance of the prostate gland. Diffuse symmetric urinary bladder wall thickening. Other: No abdominal wall hernia or abnormality. No abdominopelvic ascites. Musculoskeletal: No acute or significant osseous findings. IMPRESSION: Long segment of diffuse mucosal thickening of the distal sigmoid colon and proximal rectum on the background of scattered diverticula. This may represent colitis, potentially diverticulitis. Underlying malignancy cannot be entirely excluded and correlation with colonoscopy, upon resolution of the acute symptoms is recommended. Diffuse symmetric urinary bladder wall thickening and heterogeneous appearance of the prostate gland. Please correlate clinically regarding potential urinary bladder chronic outflow obstruction. Electronically Signed   By: Ted Mcalpineobrinka  Dimitrova Wright.D.   On: 02/14/2016 17:57    Assessment and Plan:   Tony Wright Motz is a 45 y.o. y/o male has been referred for abdominal pain from ER.   1. Abnormal CT scan - completed course of antibiotics for presumed diverticulitis - plan for colonoscopy as soon as his hypertension is better controlled and its safe to undergo anesthesia. 2. Does not have a PCP- he was advised to establish  with one asap. We have set up an apointment with a PCP  3. Excess alcohol consumption-Advised to DC and self refer to AA 4. Hypertension : not on any medications - Will commence on Amlodipine 5 mg and advised him to get a PCP ASAP to monitor. He absolutely is not keen on any medications and is aware of possibility of death . I suggested him to go to the urgent care and he was absolutely not interested, aware of the possibility of stroke, heart attack and death from uncontrolled hypertension.   Follow up in 3 weeks- presently he is not keen on any tests  Dr Wyline MoodKiran Jennifer Holland MD

## 2016-03-07 NOTE — Patient Instructions (Signed)
You have been scheduled with [facility] on Thursday, January 4th @ 4:00pm. Please arrive 15 mins early for this appointment. They will contact you prior to this appointment to confirm with you.    Please bring with you to this appointment:   1. Picture ID  2. A bill with proof of address  3. Proof of income  Address:  [address] [place], [place]

## 2016-07-07 ENCOUNTER — Emergency Department
Admission: EM | Admit: 2016-07-07 | Discharge: 2016-07-07 | Disposition: A | Discharge status: Home / Self Care | Payer: Medicaid Other | Attending: Emergency Medicine | Admitting: Emergency Medicine

## 2016-07-07 DIAGNOSIS — F17200 Nicotine dependence, unspecified, uncomplicated: Secondary | ICD-10-CM | POA: Insufficient documentation

## 2016-07-07 DIAGNOSIS — R45851 Suicidal ideations: Secondary | ICD-10-CM

## 2016-07-07 DIAGNOSIS — Z79899 Other long term (current) drug therapy: Secondary | ICD-10-CM | POA: Insufficient documentation

## 2016-07-07 DIAGNOSIS — F1010 Alcohol abuse, uncomplicated: Secondary | ICD-10-CM | POA: Insufficient documentation

## 2016-07-07 DIAGNOSIS — J449 Chronic obstructive pulmonary disease, unspecified: Secondary | ICD-10-CM | POA: Insufficient documentation

## 2016-07-07 LAB — URINE DRUG SCREEN, QUALITATIVE (ARMC ONLY)
AMPHETAMINES, UR SCREEN: NOT DETECTED
BENZODIAZEPINE, UR SCRN: NOT DETECTED
Barbiturates, Ur Screen: NOT DETECTED
COCAINE METABOLITE, UR ~~LOC~~: NOT DETECTED
Cannabinoid 50 Ng, Ur ~~LOC~~: NOT DETECTED
MDMA (Ecstasy)Ur Screen: NOT DETECTED
METHADONE SCREEN, URINE: NOT DETECTED
OPIATE, UR SCREEN: NOT DETECTED
Phencyclidine (PCP) Ur S: NOT DETECTED
TRICYCLIC, UR SCREEN: NOT DETECTED

## 2016-07-07 LAB — COMPREHENSIVE METABOLIC PANEL
ALT: 10 U/L — AB (ref 17–63)
AST: 28 U/L (ref 15–41)
Albumin: 4.5 g/dL (ref 3.5–5.0)
Alkaline Phosphatase: 89 U/L (ref 38–126)
Anion gap: 12 (ref 5–15)
BILIRUBIN TOTAL: 0.5 mg/dL (ref 0.3–1.2)
BUN: 14 mg/dL (ref 6–20)
CALCIUM: 9.6 mg/dL (ref 8.9–10.3)
CHLORIDE: 109 mmol/L (ref 101–111)
CO2: 20 mmol/L — ABNORMAL LOW (ref 22–32)
CREATININE: 1.21 mg/dL (ref 0.61–1.24)
Glucose, Bld: 77 mg/dL (ref 65–99)
Potassium: 4.1 mmol/L (ref 3.5–5.1)
Sodium: 141 mmol/L (ref 135–145)
TOTAL PROTEIN: 8.4 g/dL — AB (ref 6.5–8.1)

## 2016-07-07 LAB — CBC
HCT: 47.1 % (ref 40.0–52.0)
Hemoglobin: 16.1 g/dL (ref 13.0–18.0)
MCH: 32.2 pg (ref 26.0–34.0)
MCHC: 34.2 g/dL (ref 32.0–36.0)
MCV: 94.1 fL (ref 80.0–100.0)
Platelets: 219 10*3/uL (ref 150–440)
RBC: 5 MIL/uL (ref 4.40–5.90)
RDW: 14.9 % — AB (ref 11.5–14.5)
WBC: 10.5 10*3/uL (ref 3.8–10.6)

## 2016-07-07 LAB — ETHANOL: ALCOHOL ETHYL (B): 274 mg/dL — AB (ref <5)

## 2016-07-07 LAB — ACETAMINOPHEN LEVEL: Acetaminophen (Tylenol), Serum: <10 ug/mL — ABNORMAL LOW (ref 10–30)

## 2016-07-07 LAB — SALICYLATE LEVEL: Salicylate Lvl: <7 mg/dL (ref 2.8–30.0)

## 2016-07-07 NOTE — ED Provider Notes (Signed)
[facility] Emergency Department Provider Note  ____________________________________________  Time seen: Approximately 8:32 PM  I have reviewed the triage vital signs and the nursing notes.   HISTORY  Chief Complaint Suicidal    HPI Tony Wright is a 46 y.o. male brought to the emergency department under involuntary commitment due to holding a knife while threatening to kill himself and stating he had nothing to live for because his partner left him. This started today and has been a constant problem for him throughout the afternoon. No other aggravating or alleviating factors.  Patient now states that he feels fine and just wants to go home in the next 10 minutes so he can smoke and drink. Denies any intent to harm himself. Denies any previous issues with depression or suicide attempts.     Past Medical History:  Diagnosis Date  . COPD (chronic obstructive pulmonary disease) [facility])      Patient Active Problem List   Diagnosis Date Noted  . Alcohol abuse 12/10/2015     History reviewed. No pertinent surgical history.   Prior to Admission medications   Medication Sig Start Date End Date Taking? Authorizing Provider  albuterol (PROVENTIL HFA;VENTOLIN HFA) 108 (90 Base) MCG/ACT inhaler Inhale 2 puffs into the lungs every 4 (four) hours as needed for wheezing or shortness of breath. 10/21/15   Myrna Blazer, MD  amLODipine (NORVASC) 5 MG tablet Take 1 tablet (5 mg total) by mouth daily. 03/07/16 04/07/16  Wyline Mood, MD     Allergies Patient has no known allergies.   No family history on file.  Social History Social History  Substance Use Topics  . Smoking status: Current Every Day Smoker  . Smokeless tobacco: Never Used  . Alcohol use Yes    Review of Systems  Constitutional:   No fever or chills.  ENT:   No sore throat. No rhinorrhea. Lymphatic: No swollen glands, No extremity swelling Endocrine: No hot/cold flashes. No  significant weight change. No neck swelling. Cardiovascular:   No chest pain or syncope. Respiratory:   No dyspnea or cough. Gastrointestinal:   Negative for abdominal pain, vomiting and diarrhea.  Genitourinary:   Negative for dysuria or difficulty urinating. Musculoskeletal:   Negative for focal pain or swelling Neurological:   Negative for headaches or weakness. All other systems reviewed and are negative except as documented above in ROS and HPI.  ____________________________________________   PHYSICAL EXAM:  VITAL SIGNS: ED Triage Vitals  Enc Vitals Group     BP 07/07/16 1947 115/78     Pulse Rate 07/07/16 1947 (!) 116     Resp 07/07/16 1947 18     Temp 07/07/16 1947 98.2 F (36.8 C)     Temp Source 07/07/16 1947 Oral     SpO2 07/07/16 1947 93 %     Weight 07/07/16 1939 100 lb (45.4 kg)     Height 07/07/16 1939  (1.676 m)     Head Circumference --      Peak Flow --      Pain Score 07/07/16 1939 0     Pain Loc --      Pain Edu? --      Excl. in GC? --     Vital signs reviewed, nursing assessments reviewed.   Constitutional:   Alert and oriented. Well appearing and in no distress. Eyes:   No scleral icterus. No conjunctival pallor. PERRL. EOMI.  No nystagmus. ENT   Head:   Normocephalic and  atraumatic.   Nose:   No congestion/rhinnorhea. No septal hematoma   Mouth/Throat:   MMM, no pharyngeal erythema. No peritonsillar mass.    Neck:   No stridor. No SubQ emphysema. No meningismus. Hematological/Lymphatic/Immunilogical:   No cervical lymphadenopathy. Cardiovascular:   RRRHeart rate 100. Symmetric bilateral radial and DP pulses.  No murmurs.  Respiratory:   Normal respiratory effort without tachypnea nor retractions. Breath sounds are clear and equal bilaterally. No wheezes/rales/rhonchi. Gastrointestinal:   Soft and nontender. Non distended. There is no CVA tenderness.  No rebound, rigidity, or guarding. Genitourinary:   deferred Musculoskeletal:    Normal range of motion in all extremities. No joint effusions.  No lower extremity tenderness.  No edema. Neurologic:   Normal speech and language.  CN 2-10 normal. Motor grossly intact. No gross focal neurologic deficits are appreciated.  Skin:    Skin is warm, dry and intact. No rash noted.  No petechiae, purpura, or bullae.  ____________________________________________    LABS (pertinent positives/negatives) (all labs ordered are listed, but only abnormal results are displayed) Labs Reviewed  COMPREHENSIVE METABOLIC PANEL - Abnormal; Notable for the following:       Result Value   CO2 20 (*)    Total Protein 8.4 (*)    ALT 10 (*)    All other components within normal limits  ETHANOL - Abnormal; Notable for the following:    Alcohol, Ethyl (B) 274 (*)    All other components within normal limits  ACETAMINOPHEN LEVEL - Abnormal; Notable for the following:    Acetaminophen (Tylenol), Serum <10 (*)    All other components within normal limits  CBC - Abnormal; Notable for the following:    RDW 14.9 (*)    All other components within normal limits  SALICYLATE LEVEL  URINE DRUG SCREEN, QUALITATIVE ([facility] ONLY)   ____________________________________________   EKG    ____________________________________________    RADIOLOGY  No results found.  ____________________________________________   PROCEDURES Procedures  ____________________________________________   INITIAL IMPRESSION / ASSESSMENT AND PLAN / ED COURSE  Pertinent labs & imaging results that were available during my care of the patient were reviewed by me and considered in my medical decision making (see chart for details).  Patient brought to ED under involuntary commitment due to suicidal ideation and threats. He now denies the symptoms in the setting of a clear agenda to be discharged home. He becomes easily agitated and verbally abusive with staff when he wants something.  He becomes very agitated when  told that he will likely be in the hospital for a few hours for psychiatry evaluation. Marland Kitchen    ----------------------------------------- 10:12 PM on 07/07/2016 -----------------------------------------  Psychiatry consult note reviewed. Feel the patient is psychiatrically stable, not a danger to himself or others. Presents with alcohol-induced mood disorder. No high-risk features. Patient is clinically sober, speaks clearly, ambulatory with steady gait, eating. IVC reversed by psychiatry consultant. Suitable for outpatient follow-up and discharge home.     ____________________________________________   FINAL CLINICAL IMPRESSION(S) / ED DIAGNOSES  Final diagnoses:  Suicidal ideation  Alcohol abuse      New Prescriptions   No medications on file     Portions of this note were generated with dragon dictation software. Dictation errors may occur despite best attempts at proofreading.    Sharman Cheek, MD 07/07/16 2213

## 2016-07-07 NOTE — BH Assessment (Signed)
Received TTS consult request. Spoke to [facility], RN who said [facility] consult is being completed first and he will call [facility] TTS when Pt is ready for TTS consult.   Harlin Rain Patsy [place], LPC, [facility], [facility] Triage Specialist [phone]

## 2016-07-07 NOTE — ED Notes (Signed)
Pt. Finished SOC, SOC machine taken out of room. 

## 2016-07-07 NOTE — ED Notes (Signed)
Pt. States he got into argument with wife tonight.  Pt. States he threated to hurt himself.  Pt. Is threatening to staff and is intoxicated.

## 2016-07-07 NOTE — ED Notes (Signed)
Gave patient turkey tray and gingerale.AS °

## 2016-07-07 NOTE — ED Notes (Signed)
Pt. States he wants to walk home.  Pt. Using his own cell phone to call for ride.

## 2016-07-07 NOTE — ED Triage Notes (Signed)
Pt arrives to ED via [employer] under IVC from home with c/o suicidal intent. IVC papers states pt had a knife in hand upon police arrival and was threatening to "stab himself in the heart.Marland Kitchenand had nothing to live for". Pt currently in forensic restraints, threatening to assault offices and staff once restraints are removed.

## 2016-07-07 NOTE — ED Notes (Signed)
Pt. Continues to come to doorway and asking to go home.  This nurse has talked to pt. Multiple times explaining how IVC works and what it will take to go home.

## 2016-10-13 ENCOUNTER — Emergency Department: Payer: Medicaid Other

## 2016-10-13 ENCOUNTER — Encounter: Payer: Self-pay | Admitting: Radiology

## 2016-10-13 ENCOUNTER — Emergency Department
Admission: EM | Admit: 2016-10-13 | Discharge: 2016-10-13 | Disposition: A | Discharge status: Other Acute Inpatient Hospital | Payer: Medicaid Other | Attending: Emergency Medicine | Admitting: Emergency Medicine

## 2016-10-13 DIAGNOSIS — Y998 Other external cause status: Secondary | ICD-10-CM | POA: Diagnosis not present

## 2016-10-13 DIAGNOSIS — I609 Nontraumatic subarachnoid hemorrhage, unspecified: Secondary | ICD-10-CM

## 2016-10-13 DIAGNOSIS — R402432 Glasgow coma scale score 3-8, at arrival to emergency department: Secondary | ICD-10-CM | POA: Diagnosis not present

## 2016-10-13 DIAGNOSIS — S065X9A Traumatic subdural hemorrhage with loss of consciousness of unspecified duration, initial encounter: Secondary | ICD-10-CM | POA: Insufficient documentation

## 2016-10-13 DIAGNOSIS — Z79899 Other long term (current) drug therapy: Secondary | ICD-10-CM | POA: Insufficient documentation

## 2016-10-13 DIAGNOSIS — J449 Chronic obstructive pulmonary disease, unspecified: Secondary | ICD-10-CM | POA: Diagnosis not present

## 2016-10-13 DIAGNOSIS — I6200 Nontraumatic subdural hemorrhage, unspecified: Secondary | ICD-10-CM

## 2016-10-13 DIAGNOSIS — Y9289 Other specified places as the place of occurrence of the external cause: Secondary | ICD-10-CM | POA: Insufficient documentation

## 2016-10-13 DIAGNOSIS — F17200 Nicotine dependence, unspecified, uncomplicated: Secondary | ICD-10-CM | POA: Diagnosis not present

## 2016-10-13 DIAGNOSIS — S3991XA Unspecified injury of abdomen, initial encounter: Secondary | ICD-10-CM | POA: Insufficient documentation

## 2016-10-13 DIAGNOSIS — S066X9A Traumatic subarachnoid hemorrhage with loss of consciousness of unspecified duration, initial encounter: Secondary | ICD-10-CM | POA: Insufficient documentation

## 2016-10-13 DIAGNOSIS — S02109A Fracture of base of skull, unspecified side, initial encounter for closed fracture: Secondary | ICD-10-CM | POA: Diagnosis not present

## 2016-10-13 DIAGNOSIS — S0990XA Unspecified injury of head, initial encounter: Secondary | ICD-10-CM | POA: Diagnosis present

## 2016-10-13 DIAGNOSIS — Y9389 Activity, other specified: Secondary | ICD-10-CM | POA: Diagnosis not present

## 2016-10-13 LAB — COMPREHENSIVE METABOLIC PANEL
ALK PHOS: 84 U/L (ref 38–126)
ALT: 13 U/L — AB (ref 17–63)
AST: 32 U/L (ref 15–41)
Albumin: 4.3 g/dL (ref 3.5–5.0)
Anion gap: 10 (ref 5–15)
BUN: 11 mg/dL (ref 6–20)
CALCIUM: 8.9 mg/dL (ref 8.9–10.3)
CHLORIDE: 104 mmol/L (ref 101–111)
CO2: 24 mmol/L (ref 22–32)
CREATININE: 0.99 mg/dL (ref 0.61–1.24)
Glucose, Bld: 105 mg/dL — ABNORMAL HIGH (ref 65–99)
Potassium: 3.3 mmol/L — ABNORMAL LOW (ref 3.5–5.1)
SODIUM: 138 mmol/L (ref 135–145)
Total Bilirubin: 0.4 mg/dL (ref 0.3–1.2)
Total Protein: 8 g/dL (ref 6.5–8.1)

## 2016-10-13 LAB — ETHANOL: Alcohol, Ethyl (B): 338 mg/dL (ref <5)

## 2016-10-13 LAB — CBC
HEMATOCRIT: 44.6 % (ref 40.0–52.0)
Hemoglobin: 15.4 g/dL (ref 13.0–18.0)
MCH: 31.5 pg (ref 26.0–34.0)
MCHC: 34.6 g/dL (ref 32.0–36.0)
MCV: 91.1 fL (ref 80.0–100.0)
PLATELETS: 161 10*3/uL (ref 150–440)
RBC: 4.9 MIL/uL (ref 4.40–5.90)
RDW: 13.2 % (ref 11.5–14.5)
WBC: 6.9 10*3/uL (ref 3.8–10.6)

## 2016-10-13 LAB — URINE DRUG SCREEN, QUALITATIVE (ARMC ONLY)
Amphetamines, Ur Screen: NOT DETECTED
BARBITURATES, UR SCREEN: NOT DETECTED
BENZODIAZEPINE, UR SCRN: NOT DETECTED
CANNABINOID 50 NG, UR ~~LOC~~: NOT DETECTED
Cocaine Metabolite,Ur ~~LOC~~: NOT DETECTED
MDMA (Ecstasy)Ur Screen: NOT DETECTED
Methadone Scn, Ur: NOT DETECTED
OPIATE, UR SCREEN: NOT DETECTED
PHENCYCLIDINE (PCP) UR S: NOT DETECTED
Tricyclic, Ur Screen: NOT DETECTED

## 2016-10-13 MED ORDER — LIDOCAINE VISCOUS 2 % MT SOLN
OROMUCOSAL | Status: AC | PRN
  Administered 2016-10-13: 5 mL via OROMUCOSAL

## 2016-10-13 MED ORDER — IOPAMIDOL (ISOVUE-300) INJECTION 61%
100.0000 mL | Freq: Once | INTRAVENOUS | Status: AC | PRN
Start: 2016-10-13 — End: 2016-10-13
  Administered 2016-10-13: 100 mL via INTRAVENOUS

## 2016-10-13 MED ORDER — ETOMIDATE 2 MG/ML IV SOLN
INTRAVENOUS | Status: AC | PRN
  Administered 2016-10-13: 20 mg via INTRAVENOUS

## 2016-10-13 MED ORDER — SODIUM CHLORIDE 0.9 % IV SOLN
INTRAVENOUS | Status: AC | PRN
  Administered 2016-10-13: 150 mL/h via INTRAVENOUS

## 2016-10-13 MED ORDER — ROCURONIUM BROMIDE 50 MG/5ML IV SOLN
10.0000 mg | Freq: Once | INTRAVENOUS | Status: AC
  Administered 2016-10-13: 10 mg via INTRAVENOUS

## 2016-10-13 MED ORDER — PROPOFOL 1000 MG/100ML IV EMUL
INTRAVENOUS | Status: AC | PRN
  Administered 2016-10-13: 03:00:00 via INTRAVENOUS
  Administered 2016-10-13: 10.076 ug/kg/min via INTRAVENOUS

## 2016-10-13 MED ORDER — ROCURONIUM BROMIDE 50 MG/5ML IV SOLN
INTRAVENOUS | Status: AC | PRN
  Administered 2016-10-13: 6.12 mg via INTRAVENOUS

## 2016-10-13 MED ORDER — SODIUM CHLORIDE 0.9 % IV SOLN
1000.0000 mg | Freq: Once | INTRAVENOUS | Status: DC
  Filled 2016-10-13: qty 10
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
## 2016-10-13 NOTE — ED Notes (Signed)
Report given to [employer], Report given to Clovis CaoMike Smith, RN. ETA approximately 45 minutes per transport.

## 2016-10-13 NOTE — ED Notes (Signed)
Keppra now delivered per Pharmacy and given to [employer] for administration.

## 2016-10-13 NOTE — ED Notes (Signed)
Pt values and cash counted and handed to officer. Earring was included.

## 2016-10-13 NOTE — ED Notes (Signed)
Patient transported to CT per this RN , Respiratory and EDT.

## 2016-10-13 NOTE — ED Notes (Addendum)
Per Tony Wright, Pts personal belongings of cash in the amount of $635.00 is not to travel with pt in [facility] transport due to policy indicating that transport can carry $100.00 or less. Pts cash placed back in security lock up, same envelope used, envelope never opened.

## 2016-10-13 NOTE — ED Notes (Signed)
Pt off unit for head CT.
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
## 2016-10-13 NOTE — ED Notes (Signed)
Pt's head of bed elevated to prevent increased intracranial pressure.

## 2016-10-13 NOTE — ED Notes (Signed)
Pharmacy unable to deliver Keppra before transport departs, Shirlee LimerickJoseph Osborne, RN for [facility] made aware of leaving without Keppra.

## 2016-11-15 ENCOUNTER — Other Ambulatory Visit: Payer: Self-pay | Admitting: Student

## 2016-11-15 DIAGNOSIS — S066X9A Traumatic subarachnoid hemorrhage with loss of consciousness of unspecified duration, initial encounter: Secondary | ICD-10-CM

## 2016-11-23 ENCOUNTER — Ambulatory Visit
Admission: RE | Admit: 2016-11-23 | Discharge: 2016-11-23 | Disposition: A | Discharge status: Home / Self Care | Payer: Medicaid Other | Source: Ambulatory Visit | Attending: Student | Admitting: Student

## 2016-11-23 DIAGNOSIS — X58XXXA Exposure to other specified factors, initial encounter: Secondary | ICD-10-CM | POA: Insufficient documentation

## 2016-11-23 DIAGNOSIS — G9389 Other specified disorders of brain: Secondary | ICD-10-CM | POA: Insufficient documentation

## 2016-11-23 DIAGNOSIS — S0219XA Other fracture of base of skull, initial encounter for closed fracture: Secondary | ICD-10-CM | POA: Insufficient documentation

## 2016-11-23 DIAGNOSIS — S066X9A Traumatic subarachnoid hemorrhage with loss of consciousness of unspecified duration, initial encounter: Secondary | ICD-10-CM | POA: Diagnosis present
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
## 2020-12-11 DEATH — deceased
